# Patient Record
Sex: Male | Born: 1995 | Race: Black or African American | Hispanic: No | Marital: Single | Smoking: Never smoker
Health system: Southern US, Community
[De-identification: ages and names within clinical notes are randomized; demographics above are authoritative.]

---
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
## 2015-03-01 ENCOUNTER — Emergency Department (HOSPITAL_COMMUNITY)
Admission: EM | Admit: 2015-03-01 | Discharge: 2015-03-01 | Disposition: A | Discharge status: Home / Self Care | Payer: 59 | Attending: Emergency Medicine | Admitting: Emergency Medicine

## 2015-03-01 ENCOUNTER — Emergency Department (HOSPITAL_COMMUNITY): Payer: 59

## 2015-03-01 ENCOUNTER — Encounter (HOSPITAL_COMMUNITY): Payer: Self-pay | Admitting: Emergency Medicine

## 2015-03-01 DIAGNOSIS — R200 Anesthesia of skin: Secondary | ICD-10-CM | POA: Diagnosis present

## 2015-03-01 DIAGNOSIS — R002 Palpitations: Secondary | ICD-10-CM | POA: Diagnosis not present

## 2015-03-01 DIAGNOSIS — R202 Paresthesia of skin: Secondary | ICD-10-CM

## 2015-03-01 DIAGNOSIS — R531 Weakness: Secondary | ICD-10-CM | POA: Diagnosis not present

## 2015-03-01 MED ORDER — HYDROXYZINE HCL 25 MG PO TABS: 25.0000 mg | ORAL_TABLET | Freq: Four times a day (QID) | ORAL | Status: AC

## 2015-03-01 NOTE — ED Provider Notes (Signed)
CSN: [id number]     Arrival date & time 03/01/15  0847 History   First MD Initiated Contact with Patient 03/01/15 [phone]     Chief Complaint  Patient presents with  . Numbness     (Consider location/radiation/quality/duration/timing/severity/associated sxs/prior Treatment) HPI Carlos Cervantes is a 19 y.o. male with no medical problems, presents to ED with complaint of chest pain, palpitations, intermittent numbness and weakness to left side. Pt states his symptoms come and go. States most of the time in the car after work. Symptoms last about 10 min and resolve on their own. Denies associated SOB. Denies fever, chills. No headache. States has been having persistent left sided chest tenderness. States "when symptom come my whole left side just feels numbn and weak." States last week he had episode of "whole body shaking." States was awake during this episode. EMS was called who evaluated him and attributed that to possibly being cold. States no more shaking episodes. Pt admits to anxiety. Denies prior cardiac problems. Currently symptom free.    History reviewed. No pertinent past medical history. History reviewed. No pertinent past surgical history. No family history on file. History  Substance Use Topics  . Smoking status: Never Smoker   . Smokeless tobacco: Not on file  . Alcohol Use: No    Review of Systems  Constitutional: Negative for fever and chills.  Respiratory: Negative for cough, chest tightness and shortness of breath.   Cardiovascular: Negative for chest pain, palpitations and leg swelling.  Gastrointestinal: Negative for nausea, vomiting, abdominal pain, diarrhea and abdominal distention.  Genitourinary: Negative for dysuria, urgency, frequency and hematuria.  Musculoskeletal: Negative for myalgias, arthralgias, neck pain and neck stiffness.  Skin: Negative for rash.  Allergic/Immunologic: Negative for immunocompromised state.  Neurological: Positive for weakness and  numbness. Negative for dizziness, light-headedness and headaches.      Allergies  Review of patient's allergies indicates no known allergies.  Home Medications   Prior to Admission medications   Not on File   BP 117/61 mmHg  Pulse 68  Temp(Src) 98.2 F (36.8 C) (Oral)  Resp 16  SpO2 99% Physical Exam  Constitutional: He appears well-developed and well-nourished. No distress.  HENT:  Head: Normocephalic and atraumatic.  Right Ear: External ear normal.  Left Ear: External ear normal.  Nose: Nose normal.  Mouth/Throat: Oropharynx is clear and moist.  Eyes: Conjunctivae and EOM are normal. Pupils are equal, round, and reactive to light.  Neck: Normal range of motion. Neck supple.  Cardiovascular: Normal rate, regular rhythm and normal heart sounds.   Pulmonary/Chest: Effort normal. No respiratory distress. He has no wheezes. He has no rales. He exhibits tenderness.  Tenderness in left lower ribs in midaxillary line. No bruising, swelling, deformity  Abdominal: Soft. Bowel sounds are normal. He exhibits no distension. There is no tenderness. There is no rebound.  Musculoskeletal: He exhibits no edema.  Neurological: He is alert.  5/5 and equal upper and lower extremity strength bilaterally. Equal grip strength bilaterally. Normal finger to nose and heel to shin. No pronator drift. Patellar reflexes 2+. Normal gait   Skin: Skin is warm and dry.  Nursing note and vitals reviewed.   ED Course  Procedures (including critical care time) Labs Review Labs Reviewed - No data to display  Imaging Review Dg Chest 2 View  03/01/2015   CLINICAL DATA:  Sharp left-sided chest pain with tachycardia for 1 week  EXAM: CHEST  2 VIEW  COMPARISON:  None.  FINDINGS: The heart size  and mediastinal contours are within normal limits. Both lungs are clear. The visualized skeletal structures are unremarkable.  IMPRESSION: No active cardiopulmonary disease.   Electronically Signed   By: Alcide CleverMark  Lukens  M.D.   On: 03/01/2015 09:44     EKG Interpretation   Date/Time:  Friday March 01 2015 09:40:21 EST Ventricular Rate:  77 PR Interval:  155 QRS Duration: 82 QT Interval:  376 QTC Calculation: 425 R Axis:   95 Text Interpretation:  Sinus rhythm Borderline right axis deviation ST  elev, probable normal early repol pattern Confirmed by HARRISON  MD,  FORREST ([id number]) on 03/01/2015 9:58:31 AM      MDM   Final diagnoses:  Paresthesia  Palpitation    Patient with numbness to the left side of the body, left face, the symptoms are intermittent. Normally occur after work on a car. Symptoms do, and go, currently asymptomatic. Exam is completely normal including full neurological exam. 6 Will get EKG and chest x-ray. Discussed with Dr. Romeo AppleHarrison who has seen patient as well. We did consider possible carotid dissection, however highly doubt since patient is currently completely asymptomatic and his symptoms do come and go. Patient does admit to possible anxiety and maybe this could be panic attacks. I have instructed him to follow-up with her primary care doctor, return if symptoms are worsening.  Filed Vitals:   03/01/15 0855  BP: 117/61  Pulse: 68  Temp: 98.2 F (36.8 C)  TempSrc: Oral  Resp: 16  SpO2: 99%       Jaynie Crumbleatyana Anayla Giannetti, PA-C 03/01/15 1555  Purvis SheffieldForrest Harrison, MD 03/02/15 1118

## 2015-03-01 NOTE — ED Notes (Signed)
Patient ambulated to x-ray.

## 2015-03-01 NOTE — ED Notes (Addendum)
Pt has requested only a male tech to perform EKG. John EMT to perform EKG.

## 2015-03-01 NOTE — Discharge Instructions (Signed)
Take vistaril as prescribed. Rest. Follow up with primary care doctor.    Emergency Department Resource Guide 1) Find a Doctor and Pay Out of Pocket Although you won't have to find out who is covered by your insurance plan, it is a good idea to ask around and get recommendations. You will then need to call the office and see if the doctor you have chosen will accept you as a new patient and what types of options they offer for patients who are self-pay. Some doctors offer discounts or will set up payment plans for their patients who do not have insurance, but you will need to ask so you aren't surprised when you get to your appointment.  2) Contact Your Local Health Department Not all health departments have doctors that can see patients for sick visits, but many do, so it is worth a call to see if yours does. If you don't know where your local health department is, you can check in your phone book. The CDC also has a tool to help you locate your state's health department, and many state websites also have listings of all of their local health departments.  3) Find a Walk-in Clinic If your illness is not likely to be very severe or complicated, you may want to try a walk in clinic. These are popping up all over the country in pharmacies, drugstores, and shopping centers. They're usually staffed by nurse practitioners or physician assistants that have been trained to treat common illnesses and complaints. They're usually fairly quick and inexpensive. However, if you have serious medical issues or chronic medical problems, these are probably not your best option.  No Primary Care Doctor: - Call Health Connect at  [phone] - they can help you locate a primary care doctor that  accepts your insurance, provides certain services, etc. - Physician Referral Service- [phone]  Chronic Pain Problems: Organization         Address  Phone   Notes  [facility]  [phone] Patients  need to be referred by their primary care doctor.   Medication Assistance: Organization         Address  Phone   Notes  [facility] [address] [place], [place] [address] [phone]) [phone] --Must be a resident of [place] -- Must have NO insurance coverage whatsoever (no Medicaid/ Medicare, etc.) -- The pt. MUST have a primary care doctor that directs their care regularly and follows them in the community   MedAssist  [phone]   [employer]  [phone]) [phone]    Agencies that provide inexpensive medical care: Organization         Address  Phone   Notes  [facility]  [phone]) [phone]   [facility]    [phone]) [phone]   [facility] [address] [place], [place] [address] [phone]   [facility] [address], [place] [phone]   [facility]anned [facility]    [phone](336) [phone]   [facility]    [phone]   [facility]  [address], [place] Phone:  [phone]) [phone], Fax:  [phone] Hours of Operation:  9 am - 6 pm, M-F.  Also accepts Medicaid/Medicare and self-pay.  [facility]  [address], [place] Phone: [phone](336) [phone], Fax: [phone]. Hours of Operation:  8:30 am -  5:30 pm, M-F.  Also accepts Medicaid and self-pay.  °[facility] [place] [address], [place] Phone: [phone]   °[facility] [address], [place], [place] [phone], Ext. 123 Mondays & Thursdays: 7-9 AM.  First 15 patients are seen on a first come, first serve basis. °  ° °Medicaid-accepting [place] Providers: ° °Organization         Address  Phone   Notes  °[facility] [address], [place] [phone] Also accepts self-pay patients.  °[facility] [address], [place] ° [phone]     °[facility] [address], [place] [phone]   °[facility] [address] [place] [address], [place] [phone]   °Veita Bland [address], [place]  ° [phone] Only accepts [place] Access Medicaid patients after they have their name applied to their card.  ° °Self-Pay (no insurance) in [place]: ° °Organization         Address  Phone   Notes  °Sickle Cell Patients, [facility] [address], [place] [phone]   °[facility] [address], [place] [phone]) [phone]   °[facility] [place] ° [address], [place] [phone]) [phone]   °[facility]/Dr. Osei-Bonsu ° [address] [place] [address], [place] or [address], [place] [phone] Phone number for both [place] and [place] locations is the same.  °[facility] [address], [place] [phone]   °[facility] [address] [place] [address], [place] or [address] [phone] °([phone]) [phone]   °[facility] [address], [place] [phone]) [phone], phone; [phone], fax Sees patients 1st and 3rd Saturday of every month.  Must not qualify for public or private insurance (i.e. Medicaid, Medicare, [place] Health Choice, Veterans' Benefits) • Household income should be no more than 200% of the poverty level •The clinic cannot treat you if you are pregnant or think you are pregnant • Sexually transmitted diseases are not treated at the clinic.  ° ° °Dental Care: °Organization         Address  Phone  Notes  °[place] [facility] [address], [place] [phone]) [phone] Accepts children up to age 21 who are enrolled in Medicaid or [place] Health Choice; pregnant women with a Medicaid card; and children who have applied for Medicaid or [place] Health Choice, but were declined,  whose parents can pay a reduced fee at time of service.  °[place] [facility] [place]  [address], [place] [phone]) [phone] Accepts children up to age 21 who are enrolled in Medicaid or [place] Health Choice; pregnant women with a Medicaid card; and children who have applied for Medicaid or [place] Health Choice, but were declined, whose parents can pay a reduced fee at time of service.  °[facility] ° [address], [place] [phone]) [phone] Patients are seen by appointment only. Walk-ins are not accepted. [facility] will see patients 18 years of age and older. °Monday - Tuesday (8am-5pm) °Most Wednesdays (8:30-5pm) °$30 per visit, cash only  °[facility] ° [address], [place] [phone]) [phone] Patients are seen by appointment only. Walk-ins   are not accepted. [facility] [place] will see patients 74 years of age and older. One Wednesday Evening (Monthly: Volunteer Based).  $30 per visit, cash only  [place]  [phone] for adults; Children under age 46, call Graduate Pediatric Dentistry at [phone]. Children aged 83-14, please call [phone] to request a pediatric application.  Dental services are provided in all areas of dental care including fillings, crowns and bridges, complete and partial dentures, implants, gum treatment, root canals, and extractions. Preventive care is also provided. Treatment is provided to both adults and children. Patients are selected via a lottery and there is often a waiting list.   [facility] [address], [place]  [phone] [email or web address]   [facility] [address] [place], [place] [phone], Ext. [phone] Second and Fourth Thursday of each month, opens at 6:30 AM; Clinic ends at 9 AM.  Patients are seen on a first-come first-served basis, and a limited number are seen during each clinic.   [facility]  [address] Hillard Danker [place], [place] [phone]   Eligibility Requirements You must have lived in [place], [place], or [place] counties for at least the last three months.   You cannot be eligible for state or federal sponsored [employer], including [employer], [place], or [employer].   You generally cannot be eligible for healthcare insurance through your employer.    How to apply: Eligibility screenings are held every Tuesday and Wednesday afternoon from 1:00 pm until 4:00 pm. You do not need an appointment for the interview!  [facility] [address] [place], [place]   Portal  [place] Department  [place]  [phone]    Behavioral Health Resources in the Community: Intensive Outpatient Programs Organization         Address  Phone  Notes  [place]. [address] [place], [place] [phone]   [facility] [address], [place], [place]   ADS: [facility] Drug S[facility] [address] [place], [place]   [place] [address],  [place], [place] or [phone]   Substance Abuse Resources Organization         Address  Phone  Notes  [facility] Drug Services  [phone]) [phone]   [place]  [phone]   [facility] [place]   Chinita Pester  [phone]   Residential & Outpatient Substance Abuse Program  [phone]   Psychological Services Organization         Address  Phone  Notes  [facility]  [place]  [phone]   [place] [address] [place] or [phone]    Mobile Crisis Teams Organization         Address  Phone  Notes  [facility], Mobile Crisis Care Unit   [phone]   [facility]  [address] [place], [place]   [place] [address] [place] [phone]    Self-Help/Support Groups Organization         Address  Phone             Notes  [place] [place] - variety of support groups  [place] Call for more information  Narcotics Anonymous (  NA), [facility] [address], [employer] [place]  2 meetings at this location   Residential Facilities manager         Address  Phone  Notes  [facility] [place],    [place]  [phone]-(501)[phone]   [facility]  [address] [place] [phone], [place], [place]   [place], [place] [phone] Admissions: 8am-3pm M-F  Incentives Substance Walker [address]    [place], [place] [phone]   [facility] [address] [place], [place], [place]   [facility] [address],  [place], [place]   [facility] - Intensive Outpatient [address], [place], [place]   [facility].,  [place], [place] [phone] or [phone]   [facility] ([facility]) [address], [place], [place] Accepts Medicaid  [facility] [address],  [place] [phone] Substance Abuse/Addiction Treatment   [facility] Organization         Address  Phone  Notes  [facility]  [phone]   Domenic Schwab, PhD [address] [place]lis [place], [place]   [phone] or [phone]   [place], [place] [phone]   [facility] [address], [place], [place] [phone] Insurance/Medicaid/sponsorship through [facility] and Families [address]                                     [place], [place] [phone] [place] [address], [place] [phone]    Dr. Adele Schilder  [phone]   [facility] Dept. 1) [address], [place] 2) [place] 3)  [place] [address], [place] [phone]  [phone]   [place] [phone] or [phone] (After Hours)

## 2015-03-01 NOTE — ED Notes (Signed)
Pt c/o intermittent numbness on left side of body and rapid heart beat x 3 weeks.  Pt states that there is nothing specific that he does to make it come on.  Pt states that last time it happened was this morning when he was riding in the car.

## 2015-06-10 IMAGING — CR DG CHEST 2V
2 series · 2 of 2 positions shown · non-contrast
Comparison: None.

CLINICAL DATA: Sharp left-sided chest pain with tachycardia for 1
week

EXAM:
CHEST  2 VIEW

[w chest pa]
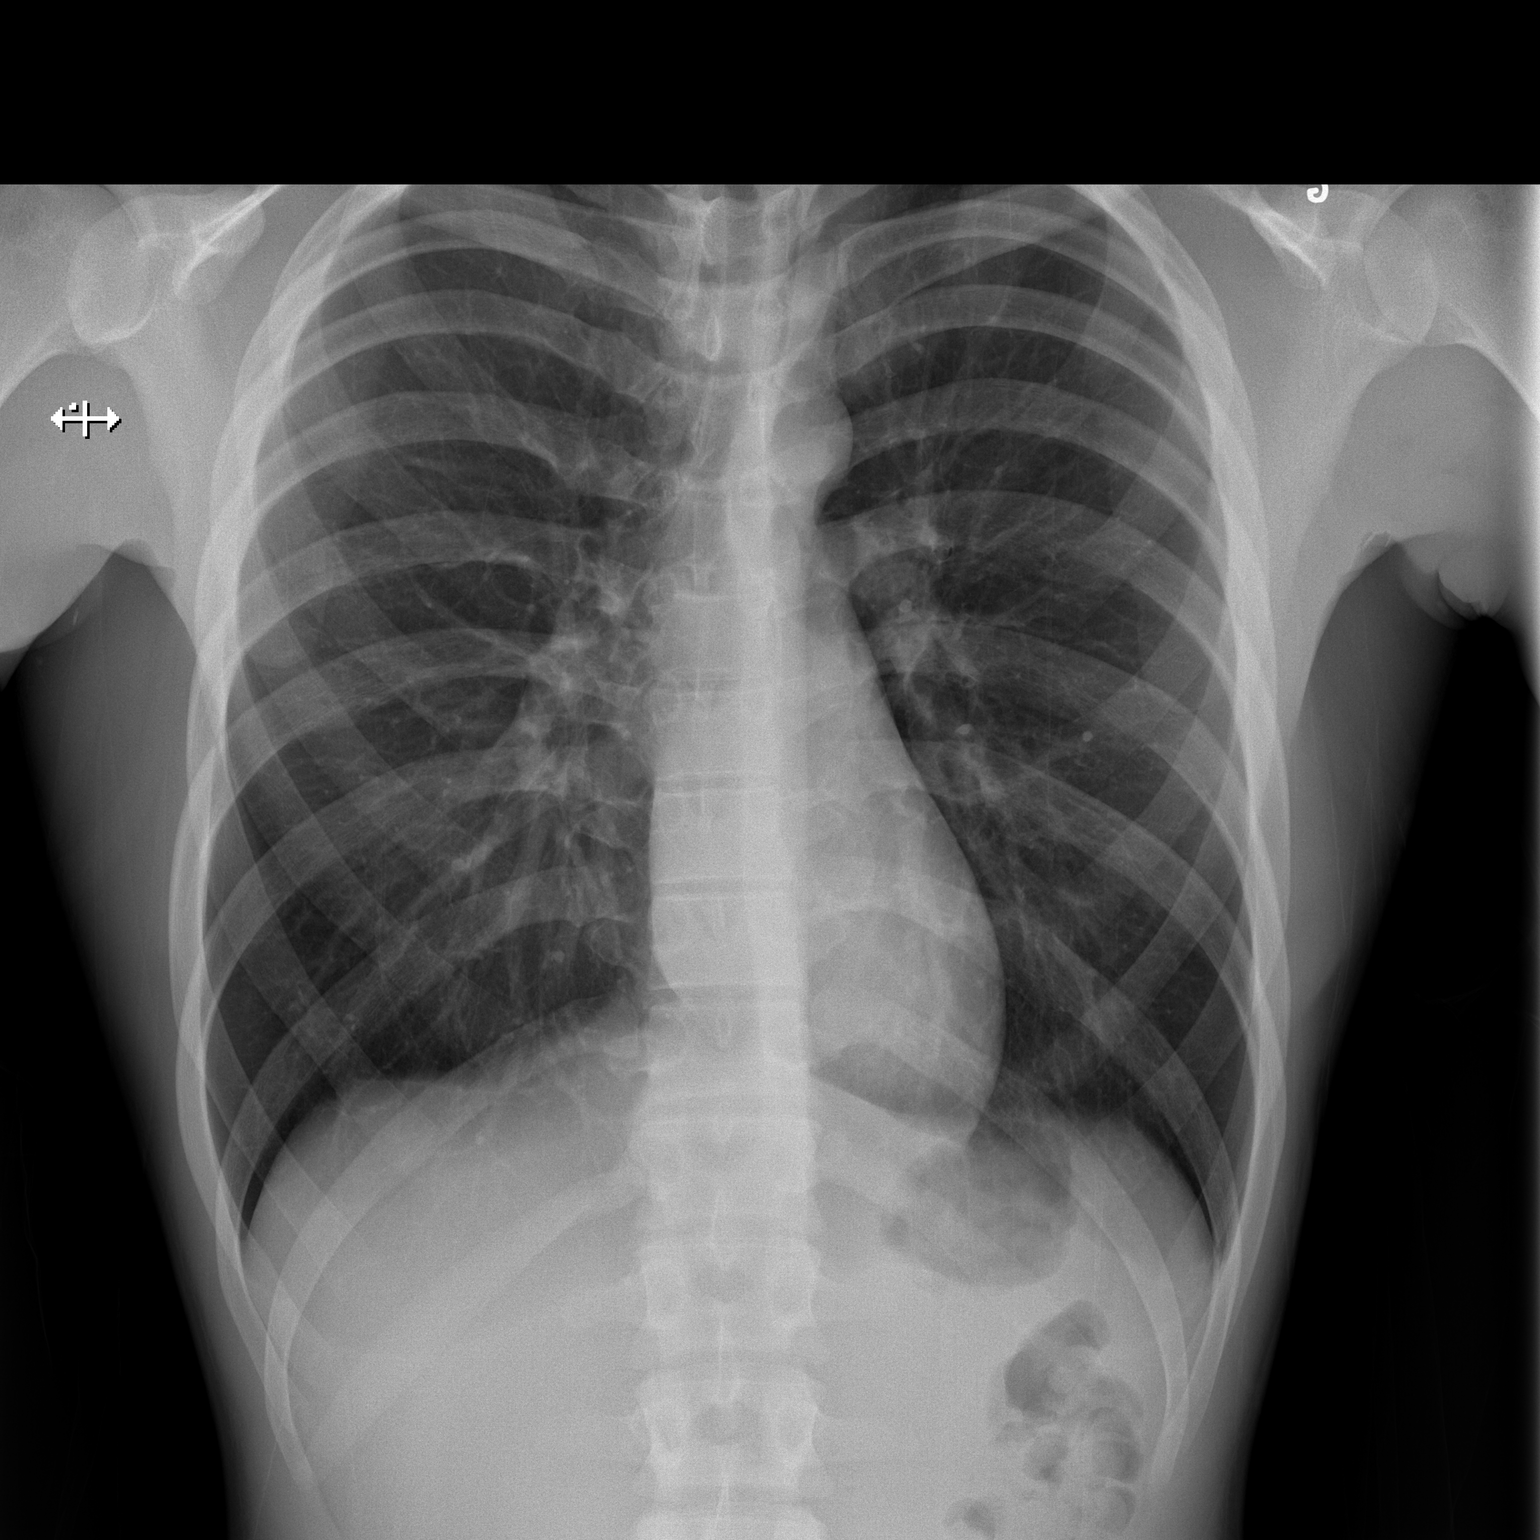

[w chest lat]
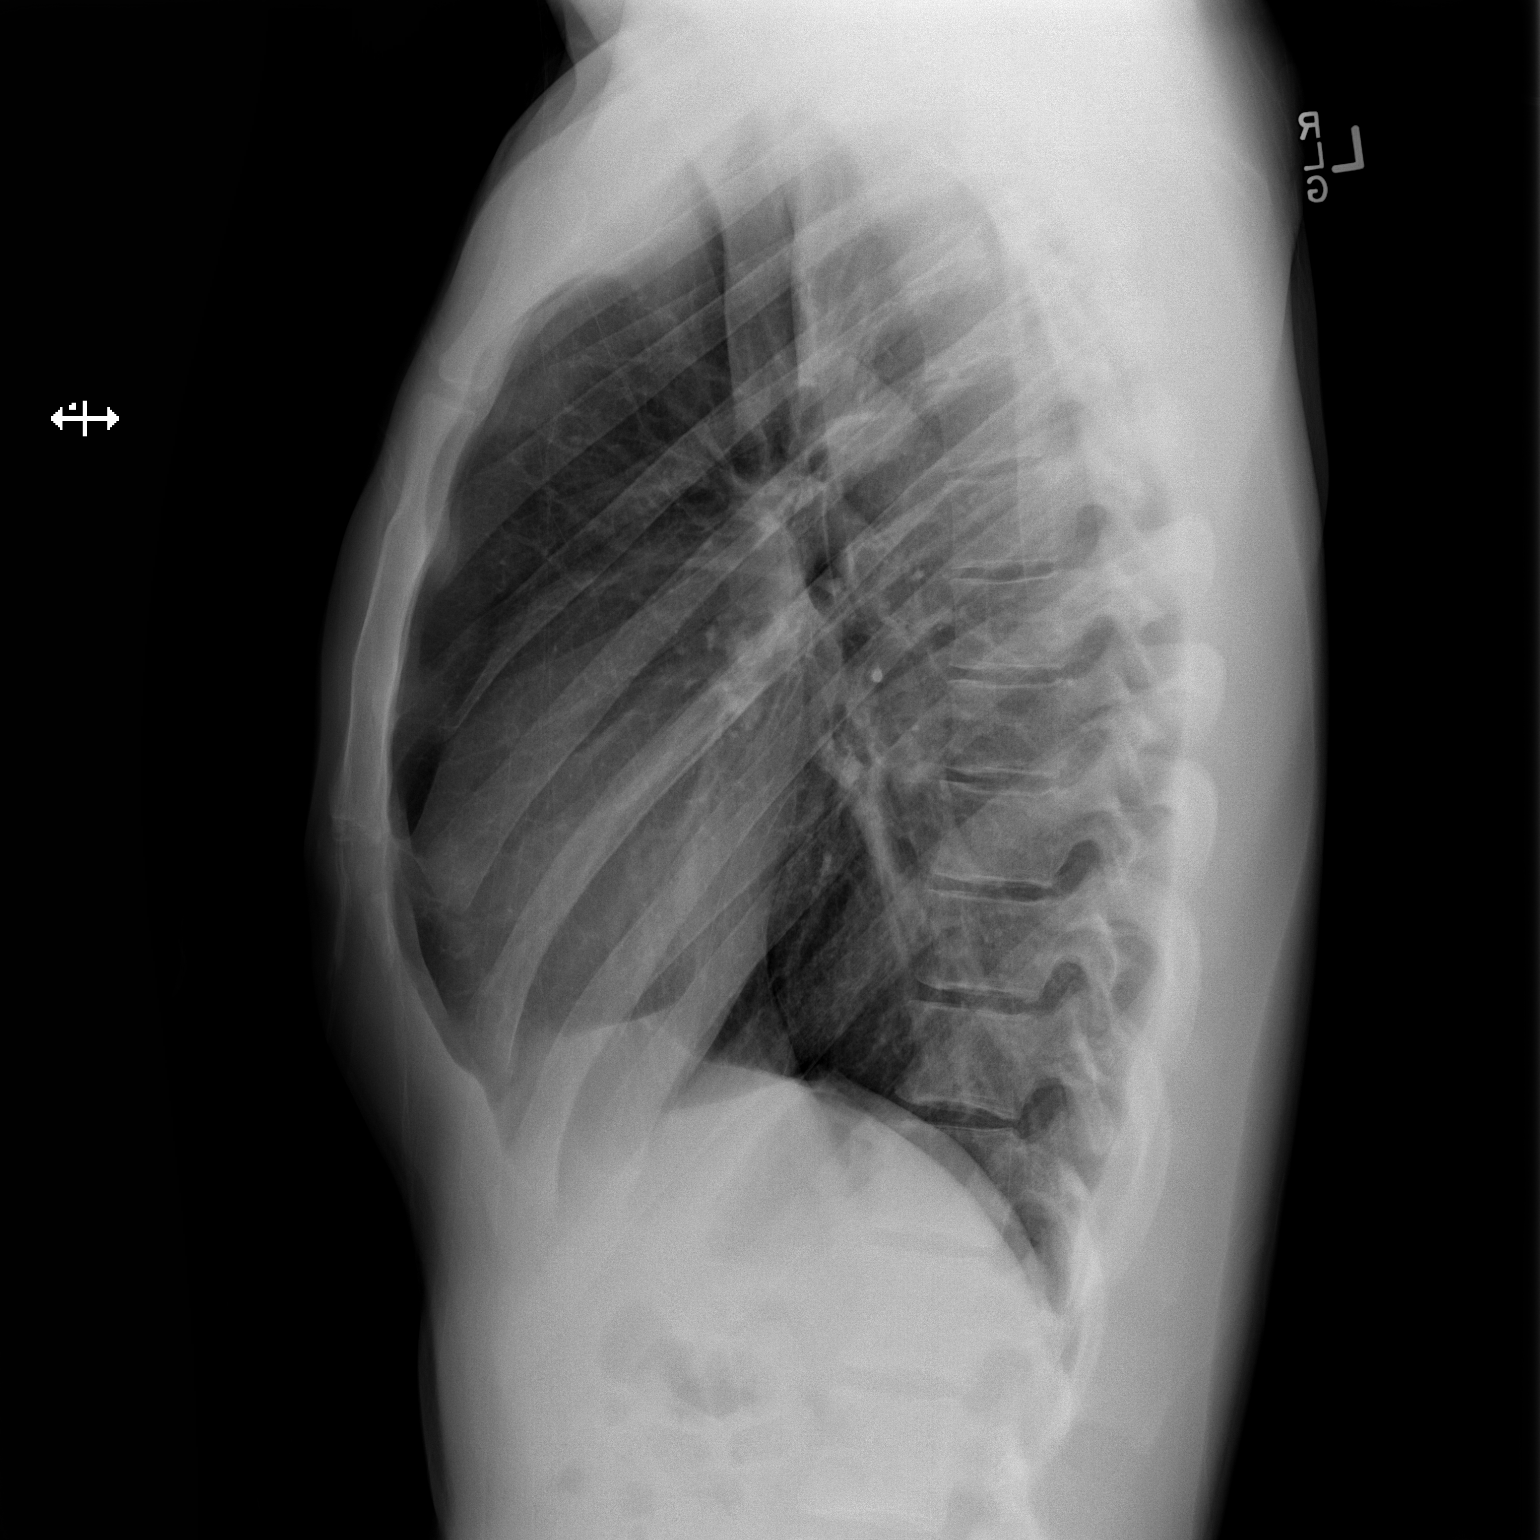

[2 of 2 positions shown; findings below may reference images not displayed]

FINDINGS: The heart size and mediastinal contours are within normal limits.
Both lungs are clear. The visualized skeletal structures are
unremarkable.
IMPRESSION: No active cardiopulmonary disease.
